# Patient Record
Sex: Male | Born: 1945 | Hispanic: No | Marital: Married | Smoking: Former smoker
Health system: Southern US, Community
[De-identification: ages and names within clinical notes are randomized; demographics above are authoritative.]

## PROBLEM LIST (undated history)

## (undated) DIAGNOSIS — N200 Calculus of kidney: Secondary | ICD-10-CM

## (undated) DIAGNOSIS — E785 Hyperlipidemia, unspecified: Secondary | ICD-10-CM

## (undated) DIAGNOSIS — K8020 Calculus of gallbladder without cholecystitis without obstruction: Secondary | ICD-10-CM

## (undated) DIAGNOSIS — E119 Type 2 diabetes mellitus without complications: Secondary | ICD-10-CM

## (undated) DIAGNOSIS — I10 Essential (primary) hypertension: Secondary | ICD-10-CM

## (undated) HISTORY — PX: KNEE ARTHROSCOPY: SHX127

## (undated) HISTORY — PX: CYSTOSCOPY: SHX5120

## (undated) HISTORY — PX: ROTATOR CUFF REPAIR: SHX139

## (undated) HISTORY — PX: ELBOW SURGERY: SHX618

## (undated) HISTORY — PX: CHOLECYSTECTOMY: SHX55

## (undated) HISTORY — PX: CARPAL TUNNEL RELEASE: SHX101

---

## 2015-04-09 ENCOUNTER — Emergency Department (HOSPITAL_COMMUNITY)
Admission: EM | Admit: 2015-04-09 | Discharge: 2015-04-09 | Disposition: A | Discharge status: Home / Self Care | Payer: Medicare Other | Attending: Emergency Medicine | Admitting: Emergency Medicine

## 2015-04-09 ENCOUNTER — Emergency Department (HOSPITAL_COMMUNITY): Payer: Medicare Other

## 2015-04-09 ENCOUNTER — Encounter (HOSPITAL_COMMUNITY): Payer: Self-pay

## 2015-04-09 DIAGNOSIS — I10 Essential (primary) hypertension: Secondary | ICD-10-CM | POA: Insufficient documentation

## 2015-04-09 DIAGNOSIS — Z87442 Personal history of urinary calculi: Secondary | ICD-10-CM | POA: Diagnosis not present

## 2015-04-09 DIAGNOSIS — S199XXA Unspecified injury of neck, initial encounter: Secondary | ICD-10-CM | POA: Diagnosis present

## 2015-04-09 DIAGNOSIS — S01311A Laceration without foreign body of right ear, initial encounter: Secondary | ICD-10-CM | POA: Insufficient documentation

## 2015-04-09 DIAGNOSIS — M79602 Pain in left arm: Secondary | ICD-10-CM

## 2015-04-09 DIAGNOSIS — Z79899 Other long term (current) drug therapy: Secondary | ICD-10-CM | POA: Diagnosis not present

## 2015-04-09 DIAGNOSIS — T148 Other injury of unspecified body region: Secondary | ICD-10-CM | POA: Diagnosis not present

## 2015-04-09 DIAGNOSIS — S61412A Laceration without foreign body of left hand, initial encounter: Secondary | ICD-10-CM | POA: Diagnosis not present

## 2015-04-09 DIAGNOSIS — Z7984 Long term (current) use of oral hypoglycemic drugs: Secondary | ICD-10-CM | POA: Diagnosis not present

## 2015-04-09 DIAGNOSIS — Z043 Encounter for examination and observation following other accident: Secondary | ICD-10-CM

## 2015-04-09 DIAGNOSIS — S12691A Other nondisplaced fracture of seventh cervical vertebra, initial encounter for closed fracture: Secondary | ICD-10-CM | POA: Insufficient documentation

## 2015-04-09 DIAGNOSIS — E119 Type 2 diabetes mellitus without complications: Secondary | ICD-10-CM | POA: Diagnosis not present

## 2015-04-09 DIAGNOSIS — S4992XA Unspecified injury of left shoulder and upper arm, initial encounter: Secondary | ICD-10-CM | POA: Diagnosis not present

## 2015-04-09 DIAGNOSIS — Y998 Other external cause status: Secondary | ICD-10-CM | POA: Diagnosis not present

## 2015-04-09 DIAGNOSIS — Y92410 Unspecified street and highway as the place of occurrence of the external cause: Secondary | ICD-10-CM | POA: Insufficient documentation

## 2015-04-09 DIAGNOSIS — T07XXXA Unspecified multiple injuries, initial encounter: Secondary | ICD-10-CM

## 2015-04-09 DIAGNOSIS — Y9389 Activity, other specified: Secondary | ICD-10-CM | POA: Diagnosis not present

## 2015-04-09 DIAGNOSIS — Z87891 Personal history of nicotine dependence: Secondary | ICD-10-CM | POA: Insufficient documentation

## 2015-04-09 DIAGNOSIS — Z041 Encounter for examination and observation following transport accident: Secondary | ICD-10-CM

## 2015-04-09 DIAGNOSIS — S12601A Unspecified nondisplaced fracture of seventh cervical vertebra, initial encounter for closed fracture: Secondary | ICD-10-CM

## 2015-04-09 DIAGNOSIS — Z8719 Personal history of other diseases of the digestive system: Secondary | ICD-10-CM | POA: Diagnosis not present

## 2015-04-09 DIAGNOSIS — S0990XA Unspecified injury of head, initial encounter: Secondary | ICD-10-CM | POA: Diagnosis not present

## 2015-04-09 DIAGNOSIS — S12591A Other nondisplaced fracture of sixth cervical vertebra, initial encounter for closed fracture: Secondary | ICD-10-CM | POA: Insufficient documentation

## 2015-04-09 DIAGNOSIS — S12501A Unspecified nondisplaced fracture of sixth cervical vertebra, initial encounter for closed fracture: Secondary | ICD-10-CM

## 2015-04-09 HISTORY — DX: Essential (primary) hypertension: I10

## 2015-04-09 HISTORY — DX: Hyperlipidemia, unspecified: E78.5

## 2015-04-09 HISTORY — DX: Calculus of kidney: N20.0

## 2015-04-09 HISTORY — DX: Type 2 diabetes mellitus without complications: E11.9

## 2015-04-09 HISTORY — DX: Calculus of gallbladder without cholecystitis without obstruction: K80.20

## 2015-04-09 MED ORDER — ONDANSETRON HCL 4 MG/2ML IJ SOLN
4.0000 mg | Freq: Once | INTRAMUSCULAR | Status: AC
  Administered 2015-04-09: 4 mg via INTRAVENOUS
  Filled 2015-04-09: qty 2

## 2015-04-09 MED ORDER — LIDOCAINE HCL 2 % IJ SOLN
20.0000 mL | Freq: Once | INTRAMUSCULAR | Status: AC
  Administered 2015-04-09: 400 mg via INTRADERMAL
  Filled 2015-04-09: qty 40

## 2015-04-09 MED ORDER — HYDROCODONE-ACETAMINOPHEN 5-325 MG PO TABS
1.0000 | ORAL_TABLET | Freq: Once | ORAL | Status: AC
  Administered 2015-04-09: 1 via ORAL
  Filled 2015-04-09: qty 1

## 2015-04-09 MED ORDER — HYDROMORPHONE HCL 1 MG/ML IJ SOLN
1.0000 mg | Freq: Once | INTRAMUSCULAR | Status: AC
  Administered 2015-04-09: 1 mg via INTRAVENOUS
  Filled 2015-04-09: qty 1

## 2015-04-09 MED ORDER — OXYCODONE-ACETAMINOPHEN 5-325 MG PO TABS: 1.0000 | ORAL_TABLET | ORAL | Status: DC | PRN

## 2015-04-09 MED ORDER — HYDROCODONE-ACETAMINOPHEN 5-325 MG PO TABS: 1.0000 | ORAL_TABLET | Freq: Once | ORAL | Status: DC
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
## 2015-04-09 NOTE — Discharge Instructions (Signed)
Wear the cervical collar, except when changing or bathing. Use a sling on your left arm, to help with your left shoulder discomfort. Keep her wounds clean with soap and water daily. See her doctor for suture removal, in 7 days.  Cervical Collar A cervical collar is a device that supports your chin and the back of your head. It is used after a severe neck injury to protect your head and neck. It does this by restricting the movement of the top part of your spine, which is located in your neck. A cervical collar may be used when you have:  A fractured neck.  Ligament damage.  A spinal cord injury. WHAT INSTRUCTIONS SHOULD I FOLLOW?  Wear the collar for as long as your health care provider instructs.  Follow your health care provider's instructions about how to put on and take off your collar.  Do not make your collar so tight that you feel pain or it is hard for you to breathe.  Do not remove the collar unless your health care provider says it is okay. Ask your health care provider if you can remove the collar for showering or eating or to apply ice.  Do not drive a car until your health care provider says it is okay.  Keep all follow-up visits as directed by your health care provider. This is important. Any delay in getting necessary care can keep your injury from healing properly.  Apply ice to the injured area:  Put ice in a plastic bag.  Place a towel between your skin and the bag.  Leave the ice on for 20 minutes, 2-3 times per day for the first 2 days.   This information is not intended to replace advice given to you by your health care provider. Make sure you discuss any questions you have with your health care provider.   Document Released: 01/05/2004 Document Revised: 05/05/2014 Document Reviewed: 11/21/2013 Elsevier Interactive Patient Education 2016 [employer]  Cervical Spine Fracture, Stable A cervical spine fracture is a break or crack in one of the bones of  the neck. A fracture is stable if the chances of it causing you problems while it is healing are very small. CAUSES   Vehicle accidents.  Injuries from sports such as diving, football, biking, wrestling, or skiing.  Occasionally, severe osteoporosis or other bone diseases, such as cancers that spread to bone or metabolic abnormalities. SYMPTOMS   Severe neck pain after an accident or fall.  Pain down your shoulders or arms.  Bruising or swelling on the back of your neck.  Numbness, tingling, muscle spasm, or weakness. DIAGNOSIS  Cervical spine fracture is diagnosed with the help of X-ray exams of your neck. Often a CT scan or MRI is used to confirm the diagnosis and help determine how your injury should be treated. Generally, an examination of your neck, arms, and legs, and the history of your injury prompts the health care provider to order these tests.  TREATMENT  A stable fracture needs to be treated with a brace or cervical collar. A cervical collar is a two-piece collar designed to keep your neck from moving during the healing process. HOME CARE INSTRUCTIONS  Limit physical activity to prevent worsening of the fracture.  Apply ice to areas of pain 3-4 times a day for 2 days.  Put ice in a bag.  Place a towel between your skin and the bag.  Leave the ice on for 15-20 minutes, 3-4 times a day.  You may have been given a cervical collar to wear.  Do not remove the collar unless instructed by your health care provider.  If you have long hair, keep it outside of the collar.  Ask your health care provider before making any adjustments to your collar. Minor adjustments may be required over time to improve comfort and reduce pressure on your chin or on the back of your head.  Keep your collar clean by wiping it with mild soap and water and drying it completely. The pads can be hand washed with soap and water and air dried completely.  If you are allowed to remove the collar  for cleaning or bathing, follow your health care provider's instructions on how to do so safely.  If you are allowed to remove the collar for cleaning and bathing, wash and dry the skin of your neck. Check your skin for irritation or sores. If you see any, tell your health care provider.  Only take over-the-counter or prescription medicines for pain, discomfort, or fever as directed by your health care provider.   Keep all follow-up appointments as directed by your health care provider. Not keeping an appointment could result in a chronic or permanent injury, pain, and disability. Additionally, X-rays or an MRI may be repeated 1-3 weeks after your initial appointment. This is to:  Make sure any other breaks or cracks were not missed.   Help identify stretched or torn ligaments.   Get your test results if you did not get them when you were first evaluated. The results will determine whether you need other tests or treatment. It is your responsibility to get the results. SEEK MEDICAL CARE IF: You have irritation or sores on your skin from the cervical collar. SEEK IMMEDIATE MEDICAL CARE IF:   You have increasing pain in your neck.   You develop difficulties swallowing or breathing.  You develop swelling in your neck.   You have numbness, weakness, burning pain, or movement problems in the arms or legs.   You are unable to control your bowel or bladder (incontinence).   You have problems with coordination or difficulty walking. MAKE SURE YOU:   Understand these instructions.  Will watch your condition.  Will get help right away if you are not doing well or get worse.   This information is not intended to replace advice given to you by your health care provider. Make sure you discuss any questions you have with your health care provider.   Document Released: 03/01/2004 Document Revised: 04/19/2013 Document Reviewed: 11/08/2012 Elsevier Interactive Patient Education 2016  [employer]  Musculoskeletal Pain Musculoskeletal pain is muscle and boney aches and pains. These pains can occur in any part of the body. Your caregiver may treat you without knowing the cause of the pain. They may treat you if blood or urine tests, X-rays, and other tests were normal.  CAUSES There is often not a definite cause or reason for these pains. These pains may be caused by a type of germ (virus). The discomfort may also come from overuse. Overuse includes working out too hard when your body is not fit. Boney aches also come from weather changes. Bone is sensitive to atmospheric pressure changes. HOME CARE INSTRUCTIONS   Ask when your test results will be ready. Make sure you get your test results.  Only take over-the-counter or prescription medicines for pain, discomfort, or fever as directed by your caregiver. If you were given medications for your condition, do not drive,  operate machinery or power tools, or sign legal documents for 24 hours. Do not drink alcohol. Do not take sleeping pills or other medications that may interfere with treatment.  Continue all activities unless the activities cause more pain. When the pain lessens, slowly resume normal activities. Gradually increase the intensity and duration of the activities or exercise.  During periods of severe pain, bed rest may be helpful. Lay or sit in any position that is comfortable.  Putting ice on the injured area.  Put ice in a bag.  Place a towel between your skin and the bag.  Leave the ice on for 15 to 20 minutes, 3 to 4 times a day.  Follow up with your caregiver for continued problems and no reason can be found for the pain. If the pain becomes worse or does not go away, it may be necessary to repeat tests or do additional testing. Your caregiver may need to look further for a possible cause. SEEK IMMEDIATE MEDICAL CARE IF:  You have pain that is getting worse and is not relieved by medications.  You  develop chest pain that is associated with shortness or breath, sweating, feeling sick to your stomach (nauseous), or throw up (vomit).  Your pain becomes localized to the abdomen.  You develop any new symptoms that seem different or that concern you. MAKE SURE YOU:   Understand these instructions.  Will watch your condition.  Will get help right away if you are not doing well or get worse.   This information is not intended to replace advice given to you by your health care provider. Make sure you discuss any questions you have with your health care provider.   Document Released: 04/14/2005 Document Revised: 07/07/2011 Document Reviewed: 12/17/2012 Elsevier Interactive Patient Education 2016 [employer]  Sutured Wound Care Sutures are stitches that can be used to close wounds. Taking care of your wound properly can help to prevent pain and infection. It can also help your wound to heal more quickly. HOW TO CARE FOR YOUR SUTURED WOUND Wound Care  Keep the wound clean and dry.  If you were given a bandage (dressing), you should change it at least once per day or as directed by your health care provider. You should also change it if it becomes wet or dirty.  Keep the wound completely dry for the first 24 hours or as directed by your health care provider. After that time, you may shower or bathe. However, make sure that the wound is not soaked in water until the sutures have been removed.  Clean the wound one time each day or as directed by your health care provider.  Wash the wound with soap and water.  Rinse the wound with water to remove all soap.  Pat the wound dry with a clean towel. Do not rub the wound.  Aftercleaning the wound, apply a thin layer of antibioticointment as directed by your health care provider. This will help to prevent infection and keep the dressing from sticking to the wound.  Have the sutures removed as directed by your health care  provider. General Instructions  Take or apply medicines only as directed by your health care provider.  To help prevent scarring, make sure to cover your wound with sunscreen whenever you are outside after the sutures are removed and the wound is healed. Make sure to wear a sunscreen of at least 30 SPF.  If you were prescribed an antibiotic medicine or ointment, finish all of  it even if you start to feel better.  Do not scratch or pick at the wound.  Keep all follow-up visits as directed by your health care provider. This is important.  Check your wound every day for signs of infection. Watch for:   Redness, swelling, or pain.  Fluid, blood, or pus.  Raise (elevate) the injured area above the level of your heart while you are sitting or lying down, if possible.  Avoid stretching your wound.  Drink enough fluids to keep your urine clear or pale yellow. SEEK MEDICAL CARE IF:  You received a tetanus shot and you have swelling, severe pain, redness, or bleeding at the injection site.  You have a fever.  A wound that was closed breaks open.  You notice a bad smell coming from the wound.  You notice something coming out of the wound, such as wood or glass.  Your pain is not controlled with medicine.  You have increased redness, swelling, or pain at the site of your wound.  You have fluid, blood, or pus coming from your wound.  You notice a change in the color of your skin near your wound.  You need to change the dressing frequently due to fluid, blood, or pus draining from the wound.  You develop a new rash.  You develop numbness around the wound. SEEK IMMEDIATE MEDICAL CARE IF:  You develop severe swelling around the injury site.  Your pain suddenly increases and is severe.  You develop painful lumps near the wound or on skin that is anywhere on your body.  You have a red streak going away from your wound.  The wound is on your hand or foot and you cannot  properly move a finger or toe.  The wound is on your hand or foot and you notice that your fingers or toes look pale or bluish.   This information is not intended to replace advice given to you by your health care provider. Make sure you discuss any questions you have with your health care provider.   Document Released: 05/22/2004 Document Revised: 08/29/2014 Document Reviewed: 11/24/2012 Elsevier Interactive Patient Education 2016 [employer]  Vertebral Fracture A vertebral fracture is a break in one of the bones that make up the spine (vertebrae). The vertebrae are stacked on top of each other to form the spinal column. They support the body and protect the spinal cord. The vertebral column has an upper part (cervical spine), a middle part (thoracic spine), and a lower part (lumbar spine). Most vertebral fractures occur in the thoracic spine or lumbar spine. There are three main types of vertebral fractures:  Flexion fracture. This happens when vertebrae collapse. Vertebrae can collapse:  In the front (compression fracture). This type of fracture is common in people who have a condition that causes their bones to be weak and brittle (osteoporosis). The fracture can make a person lose height.  In the front and back (axial burst fracture).  Extension fracture. This happens when an external force pulls apart the vertebrae.  Rotation fracture. This happens when the spine bends extremely in one direction. This type can cause a piece of a vertebra to break off (transverse process fracture) or move out of its normal position (fracture dislocation). This type of fracture has a high risk for spinal cord injury. Vertebral fractures can range from mild to very severe. The most severe types are those that cause the broken bones to move out of place (unstable) and those that injure  or press on the spinal cord. CAUSES This condition is usually caused by a forceful injury. This type of injury commonly  results from:  Car accidents.  Falling or jumping from a great height.  Collisions in contact sports.  Violent acts, such as an assault or a gunshot wound. RISK FACTORS This injury is more likely to happen to people who:  Have osteoporosis.  Participate in contact sports.  Are in situations that could result in falls or other violent injuries. SYMPTOMS Symptoms of this injury depend on the location and the type of fracture. The most common symptom is back pain that gets worse with movement. You may also have trouble standing or walking. If a fracture has damaged your spinal cord or is pressing on it, you may also have:  Numbness.  Tingling.  Weakness.  Loss of movement.  Loss of bowel or bladder control. DIAGNOSIS This injury may be diagnosed based on symptoms, medical history, and a physical exam. You may also have imaging tests to confirm the diagnosis. These may include:  Spine X-ray.  CT scan.  MRI. TREATMENT Treatment for this injury depends on the type of fracture. If your fracture is stable and does not affect your spinal cord, it may heal with nonsurgical treatment, such as:  Taking pain medicine.  Wearing a cast or a brace.  Doing physical therapy exercises. If your vertebral fracture is unstable or it affects your spinal cord, you may need surgical treatment, such as:  Laminectomy. This procedure involves removing the part of a vertebra that is pushing on the spinal cord (spinal decompression surgery). Bone fragments may also be removed.  Spinal fusion. This procedure is used to stabilize an unstable fracture. Vertebrae may be joined together with a piece of bone from another part of your body (graft) and held in place with rods, plates, or screws.  Vertebroplasty. In this procedure, bone cement is used to rebuild collapsed vertebrae. HOME CARE INSTRUCTIONS General Instructions  Take medicines only as directed by your health care provider.  Do not  drive or operate heavy machinery while taking pain medicine.  If directed, apply ice to the injured area:  Put ice in a plastic bag.  Place a towel between your skin and the bag.  Leave the ice on for 30 minutes every two hours at first. Then apply the ice as needed.  Wear your neck brace or back brace as directed by your health care provider.  Do not drink alcohol. Alcohol can interfere with your treatment.  Keep all follow-up visits as directed by your health care provider. This is important. It can help to prevent permanent injury, disability, and long-lasting (chronic) pain. Activity  Stay in bed (on bed rest) only as directed by your health care provider. Being on bed rest for too long can make your condition worse.  Return to your normal activities as directed by your health care provider. Ask what activities are safe for you.  Do exercises to improve motion and strength in your back (physical therapy), as recommended by your health care provider.   Exercise regularly as directed by your health care provider. SEEK MEDICAL CARE IF:  You have a fever.  You develop a cough that makes your pain worse.  Your pain medicine is not helping.  Your pain does not get better over time.  You cannot return to your normal activities as planned or expected. SEEK IMMEDIATE MEDICAL CARE IF:  Your pain is very bad and it suddenly gets  worse.  You are unable to move any body part (paralysis) that is below the level of your injury.  You have numbness, tingling, or weakness in any body part that is below the level of your injury.  You cannot control your bladder or bowels.   This information is not intended to replace advice given to you by your health care provider. Make sure you discuss any questions you have with your health care provider.   Document Released: 05/22/2004 Document Revised: 08/29/2014 Document Reviewed: 04/19/2014 Elsevier Interactive Patient Education [employer].

## 2015-04-09 NOTE — ED Notes (Signed)
Family at bedside. 

## 2015-04-09 NOTE — ED Notes (Signed)
EKG completed per nursing order, [name]. Will export when ordered.

## 2015-06-18 DIAGNOSIS — I10 Essential (primary) hypertension: Secondary | ICD-10-CM | POA: Insufficient documentation

## 2015-06-18 DIAGNOSIS — K760 Fatty (change of) liver, not elsewhere classified: Secondary | ICD-10-CM | POA: Insufficient documentation

## 2015-06-18 DIAGNOSIS — J449 Chronic obstructive pulmonary disease, unspecified: Secondary | ICD-10-CM

## 2015-06-18 DIAGNOSIS — M1990 Unspecified osteoarthritis, unspecified site: Secondary | ICD-10-CM

## 2015-06-18 DIAGNOSIS — E119 Type 2 diabetes mellitus without complications: Secondary | ICD-10-CM | POA: Insufficient documentation

## 2015-06-18 HISTORY — DX: Chronic obstructive pulmonary disease, unspecified: J44.9

## 2015-06-18 HISTORY — DX: Type 2 diabetes mellitus without complications: E11.9

## 2015-06-18 HISTORY — DX: Essential (primary) hypertension: I10

## 2015-06-18 HISTORY — DX: Unspecified osteoarthritis, unspecified site: M19.90

## 2015-06-18 HISTORY — DX: Fatty (change of) liver, not elsewhere classified: K76.0

## 2015-09-07 DIAGNOSIS — E782 Mixed hyperlipidemia: Secondary | ICD-10-CM | POA: Insufficient documentation

## 2015-09-07 DIAGNOSIS — J310 Chronic rhinitis: Secondary | ICD-10-CM | POA: Insufficient documentation

## 2015-09-07 DIAGNOSIS — K219 Gastro-esophageal reflux disease without esophagitis: Secondary | ICD-10-CM

## 2015-09-07 HISTORY — DX: Chronic rhinitis: J31.0

## 2015-09-07 HISTORY — DX: Mixed hyperlipidemia: E78.2

## 2015-09-07 HISTORY — DX: Gastro-esophageal reflux disease without esophagitis: K21.9

## 2016-12-19 DIAGNOSIS — E538 Deficiency of other specified B group vitamins: Secondary | ICD-10-CM | POA: Insufficient documentation

## 2016-12-19 HISTORY — DX: Deficiency of other specified B group vitamins: E53.8
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
## 2017-04-15 DIAGNOSIS — Z01818 Encounter for other preprocedural examination: Secondary | ICD-10-CM

## 2017-04-23 DIAGNOSIS — E559 Vitamin D deficiency, unspecified: Secondary | ICD-10-CM | POA: Insufficient documentation

## 2017-04-23 HISTORY — DX: Vitamin D deficiency, unspecified: E55.9

## 2017-12-30 DIAGNOSIS — M5136 Other intervertebral disc degeneration, lumbar region: Secondary | ICD-10-CM

## 2017-12-30 HISTORY — DX: Other intervertebral disc degeneration, lumbar region: M51.36

## 2018-02-15 DIAGNOSIS — M5030 Other cervical disc degeneration, unspecified cervical region: Secondary | ICD-10-CM

## 2018-02-15 HISTORY — DX: Other cervical disc degeneration, unspecified cervical region: M50.30

## 2018-05-19 DIAGNOSIS — M47816 Spondylosis without myelopathy or radiculopathy, lumbar region: Secondary | ICD-10-CM

## 2018-05-19 HISTORY — DX: Spondylosis without myelopathy or radiculopathy, lumbar region: M47.816

## 2018-12-15 DIAGNOSIS — G8929 Other chronic pain: Secondary | ICD-10-CM

## 2018-12-15 HISTORY — DX: Other chronic pain: G89.29

## 2019-06-01 DIAGNOSIS — M48061 Spinal stenosis, lumbar region without neurogenic claudication: Secondary | ICD-10-CM | POA: Insufficient documentation

## 2019-06-01 HISTORY — DX: Spinal stenosis, lumbar region without neurogenic claudication: M48.061

## 2019-07-13 DIAGNOSIS — Z9889 Other specified postprocedural states: Secondary | ICD-10-CM | POA: Insufficient documentation

## 2019-07-13 HISTORY — DX: Other specified postprocedural states: Z98.890

## 2019-07-21 HISTORY — PX: ENDOSCOPIC LUMBAR DISCECTOMY W/ LASER: SUR438

## 2019-08-25 DIAGNOSIS — T8149XA Infection following a procedure, other surgical site, initial encounter: Secondary | ICD-10-CM | POA: Insufficient documentation

## 2019-08-25 HISTORY — DX: Infection following a procedure, other surgical site, initial encounter: T81.49XA

## 2019-09-27 HISTORY — PX: SPINE SURGERY: SHX786

## 2020-07-31 DIAGNOSIS — D692 Other nonthrombocytopenic purpura: Secondary | ICD-10-CM

## 2020-07-31 HISTORY — DX: Other nonthrombocytopenic purpura: D69.2

## 2021-02-14 DIAGNOSIS — I700 Atherosclerosis of aorta: Secondary | ICD-10-CM | POA: Insufficient documentation

## 2021-02-14 HISTORY — DX: Atherosclerosis of aorta: I70.0

## 2021-02-15 ENCOUNTER — Encounter: Payer: Self-pay | Admitting: *Deleted

## 2021-02-15 ENCOUNTER — Encounter: Payer: Self-pay | Admitting: Cardiology

## 2021-02-17 NOTE — Progress Notes (Addendum)
Cardiology Office Note:    Date:  02/18/2021   ID:  Michael Carlson, DOB Dec 17, 1945, MRN [id number]  PCP:  Gordan Payment., MD  Cardiologist:  Norman Herrlich, MD   Referring MD: Gordan Payment., MD   03/01/2021: His myocardial perfusion study is normal proceed with his planned surgery.  ASSESSMENT:    1. Preop cardiovascular exam   2. Aortic atherosclerosis (HCC)   3. Primary hypertension   4. Mixed hyperlipidemia   5. COPD, mild (HCC)    PLAN:    In order of problems listed above:  His surgery is intermittent the risk and the active.  He has multiple cardiovascular risk including age diabetes hypertension and hyperlipidemia.  He has evidence of aortic atherosclerosis not uncommon in his age group and in diabetics.  Unfortunately he has exercise intolerance and has exertional shortness of breath that is potentially anginal in nature and prior to elective surgery would like him to go myocardial perfusion study for restratification if abnormal and high risk markers to benefit from coronary angiography.  I suspect it will be reassuring and then can proceed with his planned surgery and I will send a second note to his orthopedic surgeon and will do the test in the next 1 to 2 weeks in office. I have asked him to restart his statin pravastatin 20 mg Stable continue current treatment calcium channel blocker Stable COPD he is a remote smoker Stable diabetes on oral agents  Next appointment as needed, if he has an abnormal perfusion study needs further evaluation we will call and bring him back to my office   Medication Adjustments/Labs and Tests Ordered: Current medicines are reviewed at length with the patient today.  Concerns regarding medicines are outlined above.  Orders Placed This Encounter  Procedures   MYOCARDIAL PERFUSION IMAGING   EKG 12-Lead   No orders of the defined types were placed in this encounter.    Chief Complaint  Patient presents with   Pre-op Exam     History of Present Illness:    Michael Carlson is a 75 y.o. male who is being seen today for preoperative cardiovascular evaluation at the request of Gordan Payment., MD. he has a history of COPD hypertension hyperlipidemia and aortic atherosclerosis.  CT lumbar spine 08/29/2019 showed abdominal aortic and branch vessel atherosclerosis and hepatic steatosis  I know [name] from caring for both of his parents years ago. He has no known history of heart disease but a previous CT scan showed aortic atherosclerosis and also tells me that it was seen on a [employer] screening he had done in May of this year. He had been on a statin but because of severe pain in the right shoulder.  He is pending total shoulder arthroplasty he stopped taking his statin. He has no known history of heart disease congenital rheumatic or atrial fibrillation. He has a history of COPD and at times if he pushes himself very hard especially his upper extremities and develops severe shortness of breath and he has to stop because and rest.  There is no associated chest tightness. 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 No palpitations syncope edema or orthopnea   Past Medical History:  Diagnosis Date   Aortic atherosclerosis (HCC) 02/14/2021   Formatting of this note might be different from the original. Seen on CT 2021.   Chronic left-sided low back pain with left-sided sciatica 12/15/2018  Chronic rhinitis 09/07/2015   COPD, mild (HCC) 06/18/2015   Last Assessment & Plan:  Formatting of this note might be different from the original. Not in exacerbation Not on any home medications  RT eval and treat PRN   DDD (degenerative disc disease), cervical 02/15/2018   DDD (degenerative disc disease), lumbar 12/30/2017   Diabetes mellitus without complication (HCC)    Fatty liver 06/18/2015   Gallstone    Gastroesophageal reflux disease without  esophagitis 09/07/2015   Last Assessment & Plan:  Formatting of this note might be different from the original. Protonix 40 mg daily (formulary substitution)   Resume home PPI at discharge   HTN (hypertension) 06/18/2015   Last Assessment & Plan:  Formatting of this note is different from the original. BP Readings from Last 3 Encounters:  09/30/19 134/74  09/21/19 161/77  09/19/19 142/69   HCTZ 12.5 mg daily Norvasc 5 mg daily (formulary substitution)  Statin   At discharge he will resume usual home blood pressure medications   Hyperlipemia    Hypertension    Kidney stones    Lumbar spondylosis 05/19/2018   Mixed hyperlipidemia 09/07/2015   Last Assessment & Plan:  Formatting of this note might be different from the original. - Continue home crestor   Osteoarthritis 06/18/2015   Other specified postprocedural states 07/13/2019   Formatting of this note might be different from the original. 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 S/p bilateral decompression of L3-S1 in Feb. S/P two I&Ds, 3/21 and 09/17/19 Previously treated with PO augmentin and Bactrim DS  repeat I&D on 6/1 with wound vac placement Afebrile and without leukocytosis  ID consulted for antibiotic recommendations Vancomycin through 11/16/19 PICC placed for administration anti   Type 2 diabetes mellitus without complication, without long-term current use of insulin (HCC) 06/18/2015   Last Assessment & Plan:  Formatting of this note might be different from the original. Hgb A1c: 8.2 Patient states well controlled at home Poorly controlled as IP likely 2/2 stress and infection  [facility] diet FSBS ACHS SSI coverage Hold oral diabetes medications Lantus 15 units nightly (increased from 10 units on 09/29/19)  At discharge he will resume  home PO medications, monitor blood glucose QID and    Vitamin B12 deficiency 12/19/2016   Vitamin D deficiency 04/23/2017    Past Surgical History:  Procedure Laterality Date   CARPAL TUNNEL RELEASE Bilateral    CHOLECYSTECTOMY     CYSTOSCOPY     Stent placement X 2   ELBOW SURGERY Right    Nerve compression by Yates   ENDOSCOPIC LUMBAR DISCECTOMY W/ LASER  07/21/2019   KNEE ARTHROSCOPY Right    ROTATOR CUFF REPAIR Left    SPINE SURGERY  09/27/2019   Irrigation and debridement lumbar wound, Surgeon: Kerri Perches O'Gara @ [facility]    Current Medications: No outpatient medications have been marked as taking for the 02/18/21 encounter (Office Visit) with Baldo Daub, MD.     Allergies:   Shrimp extract allergy skin test, Atorvastatin, Ezetimibe-simvastatin, Fenofibrate micronized, Iodine, and Enalapril   Social History   Socioeconomic History   Marital status: Married    Spouse name: Not on file   Number of children: Not on file   Years of education: Not on  file   Highest education level: Not on file  Occupational History   Not on file  Tobacco Use   Smoking status: Former    Types: Cigarettes    Quit date: 1995    Years since quitting: 27.8   Smokeless tobacco: Never  Substance and Sexual Activity   Alcohol use: Yes    Comment: 1 beer every once in a while   Drug use: No   Sexual activity: Not on file  Other Topics Concern   Not on file  Social History Narrative   Not on file   Social Determinants of Health   Financial Resource Strain: Not on file  Food Insecurity: Not on file  Transportation Needs: Not on file  Physical Activity: Not on file  Stress: Not on file  Social Connections: Not on file     Family History: The patient's family history includes COPD in his brother; Diabetes in his father and mother; Heart attack in his father; Heart disease in his father; Hypertension in his father and paternal uncle.  ROS:   ROS Please see the history of present  illness.     All other systems reviewed and are negative.  EKGs/Labs/Other Studies Reviewed:    The following studies were reviewed today:   EKG:  EKG is  ordered today.  The ekg ordered today is personally reviewed and demonstrates sinus rhythm normal EKG    Physical Exam:    VS:  BP 136/74   Pulse 71   Ht 5\' 8"  (1.727 m)   Wt 192 lb 6.4 oz (87.3 kg)   SpO2 98%   BMI 29.25 kg/m     Wt Readings from Last 3 Encounters:  02/18/21 192 lb 6.4 oz (87.3 kg)  02/14/21 192 lb (87.1 kg)     GEN:  Well nourished, well developed in no acute distress HEENT: Normal NECK: No JVD; No carotid bruits LYMPHATICS: No lymphadenopathy CARDIAC: RRR, no murmurs, rubs, gallops RESPIRATORY:  Clear to auscultation without rales, wheezing or rhonchi  ABDOMEN: Soft, non-tender, non-distended MUSCULOSKELETAL:  No edema; No deformity  SKIN: Warm and dry NEUROLOGIC:  Alert and oriented x 3 PSYCHIATRIC:  Normal affect     Signed, 02/16/21, MD  02/18/2021 9:53 AM    [facility]

## 2021-02-18 ENCOUNTER — Ambulatory Visit (INDEPENDENT_AMBULATORY_CARE_PROVIDER_SITE_OTHER): Payer: Medicare Other | Admitting: Cardiology

## 2021-02-18 ENCOUNTER — Encounter: Payer: Self-pay | Admitting: Cardiology

## 2021-02-18 ENCOUNTER — Other Ambulatory Visit: Payer: Self-pay

## 2021-02-18 VITALS — BP 136/74 | HR 71 | Ht 68.0 in | Wt 192.4 lb

## 2021-02-18 DIAGNOSIS — Z01810 Encounter for preprocedural cardiovascular examination: Secondary | ICD-10-CM | POA: Diagnosis not present

## 2021-02-18 DIAGNOSIS — I10 Essential (primary) hypertension: Secondary | ICD-10-CM | POA: Diagnosis not present

## 2021-02-18 DIAGNOSIS — I700 Atherosclerosis of aorta: Secondary | ICD-10-CM | POA: Diagnosis not present

## 2021-02-18 DIAGNOSIS — E782 Mixed hyperlipidemia: Secondary | ICD-10-CM

## 2021-02-18 DIAGNOSIS — J449 Chronic obstructive pulmonary disease, unspecified: Secondary | ICD-10-CM

## 2021-02-18 MED ORDER — PRAVASTATIN SODIUM 20 MG PO TABS: 20.0000 mg | ORAL_TABLET | Freq: Every evening | ORAL | 3 refills | Status: AC

## 2021-02-18 NOTE — Patient Instructions (Signed)
Medication Instructions:  Your physician recommends that you continue on your current medications as directed. Please refer to the Current Medication list given to you today.  *If you need a refill on your cardiac medications before your next appointment, please call your pharmacy*   Lab Work: None If you have labs (blood work) drawn today and your tests are completely normal, you will receive your results only by: . MyChart Message (if you have MyChart) OR . A paper copy in the mail If you have any lab test that is abnormal or we need to change your treatment, we will call you to review the results.   Testing/Procedures:   [facility] [address] [place], [place] [address] Phone:  [phone]    Please arrive 15 minutes prior to your appointment time for registration and insurance purposes.  The test will take approximately 3 to 4 hours to complete; you may bring reading material.  If someone comes with you to your appointment, they will need to remain in the main lobby due to limited space in the testing area. **If you are pregnant or breastfeeding, please notify the nuclear lab prior to your appointment**  How to prepare for your Myocardial Perfusion Test: . Do not eat or drink 3 hours prior to your test, except you may have water. . Do not consume products containing caffeine (regular or decaffeinated) 12 hours prior to your test. (ex: coffee, chocolate, sodas, tea). . Do bring a list of your current medications with you.  If not listed below, you may take your medications as normal. . Do wear comfortable clothes (no dresses or overalls) and walking shoes, tennis shoes preferred (No heels or open toe shoes are allowed). . Do NOT wear cologne, perfume, aftershave, or lotions (deodorant is allowed). . If these instructions are not followed, your test will have to be rescheduled.  Please report to [address] for your test.  If you have  questions or concerns about your appointment, you can call the [facility] at [phone].  If you cannot keep your appointment, please provide 24 hours notification to the Nuclear Lab, to avoid a possible $50 charge to your account.    Follow-Up: At [facility], you and your health needs are our priority.  As part of our continuing mission to provide you with exceptional heart care, we have created designated Provider Care Teams.  These Care Teams include your primary Cardiologist (physician) and Advanced Practice Providers (APPs -  Physician Assistants and Nurse Practitioners) who all work together to provide you with the care you need, when you need it.  We recommend signing up for the patient portal called "MyChart".  Sign up information is provided on this After Visit Summary.  MyChart is used to connect with patients for Virtual Visits (Telemedicine).  Patients are able to view lab/test results, encounter notes, upcoming appointments, etc.  Non-urgent messages can be sent to your provider as well.   To learn more about what you can do with MyChart, go to [email or web address].    Your next appointment:   As needed  The format for your next appointment:   In Person  Provider:   Brian Munley, MD   Other Instructions   

## 2021-02-21 ENCOUNTER — Telehealth (HOSPITAL_COMMUNITY): Payer: Self-pay | Admitting: *Deleted

## 2021-02-21 NOTE — Telephone Encounter (Signed)
Patient given detailed instructions per Myocardial Perfusion Study Information Sheet for the test on 02/28/21 Patient notified to arrive 15 minutes early and that it is imperative to arrive on time for appointment to keep from having the test rescheduled.  If you need to cancel or reschedule your appointment, please call the office within 24 hours of your appointment. . Patient verbalized understanding. Sesar Madewell Jacqueline    

## 2021-02-28 ENCOUNTER — Other Ambulatory Visit: Payer: Self-pay

## 2021-02-28 ENCOUNTER — Ambulatory Visit (INDEPENDENT_AMBULATORY_CARE_PROVIDER_SITE_OTHER): Payer: Medicare Other

## 2021-02-28 DIAGNOSIS — Z01810 Encounter for preprocedural cardiovascular examination: Secondary | ICD-10-CM

## 2021-02-28 LAB — MYOCARDIAL PERFUSION IMAGING
Angina Index: 0
Duke Treadmill Score: 5
Estimated workload: 6.8
Exercise duration (min): 5 min
Exercise duration (sec): 25 s
LV dias vol: 92 mL (ref 62–150)
LV sys vol: 40 mL
MPHR: 147 {beats}/min
Nuc Stress EF: 56 %
Peak HR: 133 {beats}/min
Percent HR: 90 %
Rest HR: 67 {beats}/min
Rest Nuclear Isotope Dose: 10.5 mCi
SDS: 1
SRS: 4
SSS: 5
ST Depression (mm): 0.9 mm
Stress Nuclear Isotope Dose: 32.3 mCi
TID: 0.96

## 2021-02-28 MED ORDER — TECHNETIUM TC 99M TETROFOSMIN IV KIT
10.5000 | PACK | Freq: Once | INTRAVENOUS | Status: AC | PRN
  Administered 2021-02-28: 10.5 via INTRAVENOUS

## 2021-02-28 MED ORDER — TECHNETIUM TC 99M TETROFOSMIN IV KIT
32.3000 | PACK | Freq: Once | INTRAVENOUS | Status: AC | PRN
  Administered 2021-02-28: 32.3 via INTRAVENOUS

## 2021-03-05 ENCOUNTER — Telehealth: Payer: Self-pay

## 2021-03-05 NOTE — Telephone Encounter (Signed)
   [facility] Pre-operative Risk Assessment    Patient Name: Michael Carlson  DOB: 08-09-1945 MRN: [id number]  HEARTCARE STAFF:  - IMPORTANT!!!!!! Under Visit Info/Reason for Call, type in Other and utilize the format Clearance MM/DD/YY or Clearance TBD. Do not use dashes or single digits. - Please review there is not already an duplicate clearance open for this procedure. - If request is for dental extraction, please clarify the # of teeth to be extracted. - If the patient is currently at the dentist's office, call Pre-Op Callback Staff (MA/nurse) to input urgent request.  - If the patient is not currently in the dentist office, please route to the Pre-Op pool.  Request for surgical clearance:  What type of surgery is being performed? Right total shoulder replacement.   When is this surgery scheduled? TBD  What type of clearance is required (medical clearance vs. Pharmacy clearance to hold med vs. Both)? Both  Are there any medications that need to be held prior to surgery and how long? None noted  Practice name and name of physician performing surgery? [facility]. Dr. Samule Dry  What is the office phone number? [phone]   7.   What is the office fax number? [phone]  8.   Anesthesia type (None, local, MAC, general) ? General   Gita Kudo 03/05/2021, 3:31 PM  _________________________________________________________________   (provider comments below)

## 2021-03-06 NOTE — Telephone Encounter (Signed)
    Patient Name: Michael Carlson  DOB: 1945/12/31 MRN: [id number]  Primary Cardiologist: Dr. Dulce Sellar  Chart reviewed as part of pre-operative protocol coverage. Patient was recently seen by Dr. Dulce Sellar on 02/18/2021 for pre-op evaluation for upcoming shoulder surgery. Myoview was ordered for further evaluation and was low risk with no evidence of ischemia or piror infarct. Therefore, OK to proceed with surgery.   I will route this recommendation to the requesting party via Epic fax function and remove from pre-op pool.  Please call with questions.  Corrin Parker, PA-C 03/06/2021, 12:24 PM

## 2023-08-04 ENCOUNTER — Other Ambulatory Visit: Payer: Self-pay | Admitting: Specialist

## 2023-08-04 ENCOUNTER — Ambulatory Visit
Admission: RE | Admit: 2023-08-04 | Discharge: 2023-08-04 | Disposition: A | Discharge status: Home / Self Care | Source: Ambulatory Visit | Attending: Specialist | Admitting: Specialist

## 2023-08-04 ENCOUNTER — Encounter: Payer: Self-pay | Admitting: Specialist

## 2023-08-04 DIAGNOSIS — G459 Transient cerebral ischemic attack, unspecified: Secondary | ICD-10-CM

## 2023-08-07 DIAGNOSIS — G459 Transient cerebral ischemic attack, unspecified: Secondary | ICD-10-CM | POA: Diagnosis not present
